# Patient Record
Sex: Female | Born: 1960 | Race: White | Hispanic: No | Marital: Married | Smoking: Never smoker
Health system: Southern US, Community
[De-identification: ages and names within clinical notes are randomized; demographics above are authoritative.]

## PROBLEM LIST (undated history)

## (undated) DIAGNOSIS — B269 Mumps without complication: Secondary | ICD-10-CM

## (undated) DIAGNOSIS — B059 Measles without complication: Secondary | ICD-10-CM

## (undated) DIAGNOSIS — E559 Vitamin D deficiency, unspecified: Secondary | ICD-10-CM

## (undated) DIAGNOSIS — E109 Type 1 diabetes mellitus without complications: Secondary | ICD-10-CM

## (undated) DIAGNOSIS — B019 Varicella without complication: Secondary | ICD-10-CM

## (undated) HISTORY — DX: Type 1 diabetes mellitus without complications: E10.9

## (undated) HISTORY — DX: Vitamin D deficiency, unspecified: E55.9

## (undated) HISTORY — DX: Mumps without complication: B26.9

## (undated) HISTORY — DX: Varicella without complication: B01.9

## (undated) HISTORY — DX: Measles without complication: B05.9

---

## 1997-10-02 ENCOUNTER — Encounter: Admission: RE | Admit: 1997-10-02 | Discharge: 1997-12-31 | Payer: Self-pay | Admitting: Obstetrics and Gynecology

## 1998-01-18 ENCOUNTER — Encounter: Admission: RE | Admit: 1998-01-18 | Discharge: 1998-04-18 | Payer: Self-pay | Admitting: *Deleted

## 1998-05-24 ENCOUNTER — Encounter (HOSPITAL_COMMUNITY): Admission: RE | Admit: 1998-05-24 | Discharge: 1998-08-22 | Payer: Self-pay | Admitting: *Deleted

## 1999-10-08 ENCOUNTER — Other Ambulatory Visit: Admission: RE | Admit: 1999-10-08 | Discharge: 1999-10-08 | Payer: Self-pay | Admitting: Obstetrics & Gynecology

## 2000-11-23 ENCOUNTER — Other Ambulatory Visit: Admission: RE | Admit: 2000-11-23 | Discharge: 2000-11-23 | Payer: Self-pay | Admitting: Obstetrics & Gynecology

## 2001-12-28 ENCOUNTER — Other Ambulatory Visit: Admission: RE | Admit: 2001-12-28 | Discharge: 2001-12-28 | Payer: Self-pay | Admitting: Obstetrics & Gynecology

## 2003-01-12 ENCOUNTER — Other Ambulatory Visit: Admission: RE | Admit: 2003-01-12 | Discharge: 2003-01-12 | Payer: Self-pay | Admitting: Obstetrics & Gynecology

## 2004-04-18 ENCOUNTER — Other Ambulatory Visit: Admission: RE | Admit: 2004-04-18 | Discharge: 2004-04-18 | Payer: Self-pay | Admitting: Obstetrics & Gynecology

## 2005-06-30 ENCOUNTER — Other Ambulatory Visit: Admission: RE | Admit: 2005-06-30 | Discharge: 2005-06-30 | Payer: Self-pay | Admitting: Obstetrics & Gynecology

## 2005-10-13 ENCOUNTER — Ambulatory Visit (HOSPITAL_COMMUNITY): Admission: RE | Admit: 2005-10-13 | Discharge: 2005-10-13 | Payer: Self-pay | Admitting: Obstetrics & Gynecology

## 2005-10-13 ENCOUNTER — Encounter (INDEPENDENT_AMBULATORY_CARE_PROVIDER_SITE_OTHER): Payer: Self-pay | Admitting: Specialist

## 2006-09-02 ENCOUNTER — Encounter: Admission: RE | Admit: 2006-09-02 | Discharge: 2006-09-02 | Payer: Self-pay | Admitting: Obstetrics & Gynecology

## 2006-09-10 ENCOUNTER — Encounter: Admission: RE | Admit: 2006-09-10 | Discharge: 2006-09-10 | Payer: Self-pay | Admitting: Obstetrics & Gynecology

## 2007-09-02 HISTORY — PX: ABLATION: SHX5711

## 2007-09-02 HISTORY — PX: ELBOW ARTHROPLASTY: SHX928

## 2008-03-18 ENCOUNTER — Emergency Department (HOSPITAL_COMMUNITY): Admission: EM | Admit: 2008-03-18 | Discharge: 2008-03-18 | Payer: Self-pay | Admitting: Emergency Medicine

## 2008-03-19 ENCOUNTER — Observation Stay (HOSPITAL_COMMUNITY): Admission: EM | Admit: 2008-03-19 | Discharge: 2008-03-20 | Payer: Self-pay | Admitting: Orthopaedic Surgery

## 2008-05-29 ENCOUNTER — Emergency Department (HOSPITAL_COMMUNITY): Admission: EM | Admit: 2008-05-29 | Discharge: 2008-05-29 | Payer: Self-pay | Admitting: Family Medicine

## 2010-09-26 ENCOUNTER — Encounter
Admission: RE | Admit: 2010-09-26 | Discharge: 2010-09-26 | Payer: Self-pay | Source: Home / Self Care | Attending: Orthopedic Surgery | Admitting: Orthopedic Surgery

## 2011-01-14 NOTE — Op Note (Signed)
[name], [name]                 ACCOUNT NO.:  [id number]   MEDICAL RECORD NO.:  [id number]          PATIENT TYPE:  OBV   LOCATION:  5041                         FACILITY:  [facility]   PHYSICIAN:  Mark C. Ophelia Charter, M.D.    DATE OF BIRTH:  1960-10-25   DATE OF PROCEDURE:  03/19/2008  DATE OF DISCHARGE:  03/20/2008                               OPERATIVE REPORT   PREOPERATIVE DIAGNOSIS:  Displaced right olecranon fracture.   POSTOPERATIVE DIAGNOSIS:  Displaced right olecranon fracture.   PROCEDURE:  Open reduction and internal fixation, right olecranon.  Tension-band wiring technique.   SURGEON:  Mark C. Ophelia Charter, MD   ANESTHESIA:  GOT plus Marcaine local.   PROCEDURE:  After induction of general anesthesia and standard prep and  draping with a proximal arm tourniquet, DuraPrep was used and  preoperative Ancef.  Surgical checklist and time-out was completed.  Posterior incision was made, and subperiosteal dissection on the  olecranon was performed.  Fracture edges were cleaned meticulously.  The  hematoma at the fracture site was irrigated.  The fracture was reduced  with a towel clip and drilled with 4.5 drill bit and a cancellous lag  screw Synthes 7.5-mm screw was inserted that was either 70 or 75 mm in  length .  This lagged across the fracture site compressing it, and the  screw was almost tightened completely down, left a little bit proud; a  small drill hole was made in the cortical bone, and a 20-gauge wire was  passed in a figure-of-eight fashion around the screw head through the  hole and then tightened down securely.  Knots were tied with a square  knot over a Cobb handle to allow for compression.  Fracture was  anatomic.  AP and lateral fluoroscopic spot films were taken with  anatomic position.  After deflation of the tourniquet, hemostasis was  obtained.  Irrigation of subcutaneous tissue and reapproximated with  Vicryl followed by skin closure.  Postop dressing and long arm  splint  was applied.  The patient tolerated the procedure well and was  transferred to the recovery room in stable condition.      Mark C. Ophelia Charter, M.D.  Electronically Signed     MCY/MEDQ  D:  04/12/2008  T:  04/13/2008  Job:  [id number]

## 2011-01-17 NOTE — Op Note (Signed)
NAMEEPSIE, WALTHALL                 ACCOUNT NO.:  [id number]   MEDICAL RECORD NO.:  [id number]          PATIENT TYPE:  AMB   LOCATION:  SDC                           FACILITY:  [facility]   PHYSICIAN:  Freddy Finner, M.D.   DATE OF BIRTH:  [birth year]   DATE OF PROCEDURE:  10/13/2005  DATE OF DISCHARGE:                                 OPERATIVE REPORT   PREOPERATIVE DIAGNOSES:  1.  Menorrhagia.  2.  Multiple endometrial polyps by sonohysterogram.   POSTOPERATIVE DIAGNOSES:  1.  Menorrhagia.  2.  Multiple endometrial polyps by sonohysterogram.   OPERATIVE PROCEDURE:  Hysteroscopy, D&C, resection of polyps, NovaSure  endometrial ablation.   SURGEON:  Dr. Jennette Kettle   ANESTHESIA:  Managed intravenous sedation, paracervical block.   INTRAOPERATIVE COMPLICATIONS:  None.   Patient is a 50 year old with very heavy menses who had sonohysterogram in  the office showing three or four polyps.  She is admitted now for  hysteroscopy, D&C, and NovaSure endometrial ablation.  She is given a bolus  of Rocephin IV preoperatively.  She is brought to the operating room, placed  under IV sedation, placed in the dorsal lithotomy position.  Betadine prep  of mons, perineum, and vagina was carried out in the usual fashion.  Sterile  drapes were applied.  Bivalve vaginal speculum was introduced.  Cervix was  grasped on the anterior lip with a single tooth tenaculum.  Paracervical  block was placed using 10 mL of 1% plain Xylocaine, 5 mL injected in each of  two locations at 4 and 8 o'clock in the vaginal fornices.  Cervix sounded to  4 cm.  Uterus sounded to 9 cm.  Cervix was progressively dilated to 25 with  [facility] dilators.  The 12.5 degree [facility] hysteroscope was introduced and the  polyps were confirmed.  Gentle, thorough curettage was carried out followed  by reinspection with the hysteroscope.  Lactated Ringer's was used for the  distending medium.  Additional polyps were seen and these were curetted and  included with the sample.  The NovaSure device was then applied in a  standard procedure and the ablation carried out.  Cavity width was 4 cm.  Cavity length was 5 cm.  Reinspection revealed adequate ablation of the  endometrium.  Photographs were made before and after ablation and are  retained in the office record.  All instruments were then removed.  The  patient was taken to the recovery room in good condition.  She will be  discharged home with routine outpatient surgical instructions for follow-up  in approximately two weeks.  She is given Vicodin to be taken as needed for  postoperative pain.     Freddy Finner, M.D.  Electronically Signed    WRN/MEDQ  D:  10/13/2005  T:  10/13/2005  Job:  [id number]

## 2011-05-30 LAB — POCT I-STAT 4, (NA,K, GLUC, HGB,HCT)
Glucose, Bld: 255 — ABNORMAL HIGH
HCT: 40
Operator id: 238531
Sodium: 137

## 2011-05-30 LAB — BASIC METABOLIC PANEL
CO2: 28
Calcium: 9.1
Chloride: 102
GFR calc Af Amer: >60
GFR calc non Af Amer: >60
Glucose, Bld: 129 — ABNORMAL HIGH

## 2011-05-30 LAB — CBC
MCHC: 34
MCV: 97.2
Platelets: 322
RDW: 13.7
WBC: 9.8

## 2015-01-15 ENCOUNTER — Other Ambulatory Visit: Payer: Self-pay | Admitting: Obstetrics & Gynecology

## 2015-01-16 LAB — CYTOLOGY - PAP

## 2015-02-27 ENCOUNTER — Encounter: Payer: Self-pay | Admitting: Internal Medicine

## 2015-05-09 ENCOUNTER — Ambulatory Visit (INDEPENDENT_AMBULATORY_CARE_PROVIDER_SITE_OTHER): Payer: BLUE CROSS/BLUE SHIELD | Admitting: Internal Medicine

## 2015-05-09 ENCOUNTER — Encounter: Payer: Self-pay | Admitting: Internal Medicine

## 2015-05-09 VITALS — BP 132/70 | HR 80 | Ht 62.0 in | Wt 155.4 lb

## 2015-05-09 DIAGNOSIS — K5900 Constipation, unspecified: Secondary | ICD-10-CM

## 2015-05-09 DIAGNOSIS — Z1211 Encounter for screening for malignant neoplasm of colon: Secondary | ICD-10-CM | POA: Diagnosis not present

## 2015-05-09 DIAGNOSIS — R1010 Upper abdominal pain, unspecified: Secondary | ICD-10-CM | POA: Diagnosis not present

## 2015-05-09 MED ORDER — NA SULFATE-K SULFATE-MG SULF 17.5-3.13-1.6 GM/177ML PO SOLN: 1.0000 | Freq: Once | ORAL | Status: DC

## 2015-05-09 NOTE — Progress Notes (Signed)
HISTORY OF PRESENT ILLNESS:  Michele Kirby is a 54 y.o. female who is referred today by her primary care provider Dr. Burnard Bunting with a chief complaint of abdominal complaints and the need for colonoscopy. She is a long-standing type I diabetic who wears insulin pump. She tells me that she began Weight Watchers and April. In June she began to develop upper abdominal pain and constipation. She was evaluated and began probiotic Align as well as on demand PPI. Also Metamucil. The following month her symptoms had resolved. She has had no recurrence. She is off the diet. She also mentions that she needs screening colonoscopy. Her grandmother had colon cancer. The patient's GI review of systems was otherwise negative. No bleeding.  REVIEW OF SYSTEMS:  All non-GI ROS negative upon comprehensive review of all systems  Past Medical History  Diagnosis Date  . Diabetes mellitus type 1   . Vitamin D deficiency   . Measles   . Mumps   . Chicken pox     Past Surgical History  Procedure Laterality Date  . Cesarean section      x 2  . Elbow arthroplasty  2009  . Ablation  2009    with D&C    Social History DACY ENRICO  reports that she has never smoked. She has never used smokeless tobacco. She reports that she drinks alcohol. She reports that she does not use illicit drugs.  family history includes Breast cancer in her mother; COPD in her maternal grandmother; Colon cancer in her maternal grandmother; Heart disease in her brother; Peripheral vascular disease in her maternal grandmother; Thyroid disease in her father and mother; Tuberculosis in her mother.  Allergies  Allergen Reactions  . Codeine     Gi upset       PHYSICAL EXAMINATION: Vital signs: BP 132/70 mmHg  Pulse 80  Ht 5\' 2"  (1.575 m)  Wt 155 lb 6.4 oz (70.489 kg)  BMI 28.42 kg/m2  Constitutional: generally well-appearing, no acute distress Psychiatric: alert and oriented x3, cooperative Eyes: extraocular movements  intact, anicteric, conjunctiva pink Mouth: oral pharynx moist, no lesions Neck: supple without thyromegaly Lymph: no lymphadenopathy Cardiovascular: heart regular rate and rhythm, no murmur Lungs: clear to auscultation bilaterally Abdomen: soft, nontender, nondistended, no obvious ascites, no peritoneal signs, normal bowel sounds, no organomegaly Rectal: Deferred until upcoming colonoscopy Extremities: no clubbing cyanosis or lower extremity edema bilaterally Skin: no lesions on visible extremities Neuro: No focal deficits.   ASSESSMENT:  #1. Transient abdominal complaints as manifested by discomfort constipation. Likely secondary to significant dietary change as described. Improved with fiber. If the pain were to recur, consider abdominal ultrasound and/or upper endoscopy #2. Colon cancer screening. Baseline risk. Appropriate candidate without contraindication #3. Insulin requiring diabetes mellitus  PLAN:  #1. Continue fiber supplementation and adequate water intake #2. Schedule screening colonoscopy.The nature of the procedure, as well as the risks, benefits, and alternatives were carefully and thoroughly reviewed with the patient. Ample time for discussion and questions allowed. The patient understood, was satisfied, and agreed to proceed. #3. Adjust insulin pump to avoid hypoglycemia. Discussed. She will touch base with Dr. Reynaldo Minium for specific instructions   A copy of this consultation note has been sent to Dr. Reynaldo Minium

## 2015-05-09 NOTE — Patient Instructions (Signed)

## 2015-05-23 ENCOUNTER — Telehealth: Payer: Self-pay

## 2015-05-23 NOTE — Telephone Encounter (Signed)
Faxed insulin pump letter to Dr. Reynaldo Minium  Left message with DJ requesting response

## 2015-06-14 ENCOUNTER — Telehealth: Payer: Self-pay

## 2015-06-14 NOTE — Telephone Encounter (Signed)
refaxed insulin pump letter to Dr. Jacquiline Doe office

## 2015-06-14 NOTE — Telephone Encounter (Signed)
Spoke with D.J. In Dr. Jacquiline Doe office.  At her request I re-faxed the insulin pump letter.

## 2015-07-10 ENCOUNTER — Telehealth: Payer: Self-pay

## 2015-07-10 NOTE — Telephone Encounter (Signed)
Spoke with Michele Kirby who requested I refax the insulin pump letter.  Awaiting response

## 2015-07-17 ENCOUNTER — Telehealth: Payer: Self-pay

## 2015-07-17 NOTE — Telephone Encounter (Signed)
Left message with patient that, per Dr. Reynaldo Minium, insulin pump instructions for procedure as follows:  Run basal rate; no bolus unless glucose is greater than 175

## 2015-07-19 ENCOUNTER — Ambulatory Visit (AMBULATORY_SURGERY_CENTER): Payer: BLUE CROSS/BLUE SHIELD | Admitting: Internal Medicine

## 2015-07-19 ENCOUNTER — Encounter: Payer: Self-pay | Admitting: Internal Medicine

## 2015-07-19 VITALS — BP 109/69 | HR 83 | Temp 97.6°F | Resp 13 | Ht 62.0 in | Wt 155.0 lb

## 2015-07-19 DIAGNOSIS — D123 Benign neoplasm of transverse colon: Secondary | ICD-10-CM | POA: Diagnosis not present

## 2015-07-19 DIAGNOSIS — Z1211 Encounter for screening for malignant neoplasm of colon: Secondary | ICD-10-CM | POA: Diagnosis not present

## 2015-07-19 DIAGNOSIS — D125 Benign neoplasm of sigmoid colon: Secondary | ICD-10-CM | POA: Diagnosis not present

## 2015-07-19 LAB — GLUCOSE, CAPILLARY
GLUCOSE-CAPILLARY: 206 mg/dL — AB (ref 65–99)
Glucose-Capillary: 145 mg/dL — ABNORMAL HIGH (ref 65–99)

## 2015-07-19 MED ORDER — SODIUM CHLORIDE 0.9 % IV SOLN
500.0000 mL | INTRAVENOUS | Status: DC
Start: 2015-07-19 — End: 2015-07-19

## 2015-07-19 NOTE — Progress Notes (Signed)
Called to room to assist during endoscopic procedure.  Patient ID and intended procedure confirmed with present staff. Received instructions for my participation in the procedure from the performing physician.  

## 2015-07-19 NOTE — Progress Notes (Signed)
A/ox3 pleased with MAC, report to Jane RN 

## 2015-07-19 NOTE — Op Note (Signed)
[facility]  [address] [place], [address]   COLONOSCOPY PROCEDURE REPORT  PATIENT: Michele Kirby, Michele Kirby  MR#: [id number] BIRTHDATE: 05/04/1961 , 49  yrs. old GENDER: female ENDOSCOPIST: Eustace Quail, MD REFERRED [id number] Reynaldo Minium, M.D. PROCEDURE DATE:  07/19/2015 PROCEDURE:   Colonoscopy, screening and Colonoscopy with snare polypectomy x 2 First Screening Colonoscopy - Avg.  risk and is 50 yrs.  old or older Yes.  Prior Negative Screening - Now for repeat screening. N/A  History of Adenoma - Now for follow-up colonoscopy & has been > or = to 3 yrs.  N/A  Polyps removed today? Yes ASA CLASS:   Class II INDICATIONS:Screening for colonic neoplasia and Colorectal Neoplasm Risk Assessment for this procedure is average risk. MEDICATIONS: Monitored anesthesia care and Propofol 400 mg IV  DESCRIPTION OF PROCEDURE:   After the risks benefits and alternatives of the procedure were thoroughly explained, informed consent was obtained.  The digital rectal exam revealed no abnormalities of the rectum.   The [id number]  endoscope was introduced through the anus and advanced to the cecum, which was identified by both the appendix and ileocecal valve. No adverse events experienced.   The quality of the prep was excellent. (Suprep was used)  The instrument was then slowly withdrawn as the colon was fully examined. Estimated blood loss is zero unless otherwise noted in this procedure report.     COLON FINDINGS: Two polyps measuring 5 mm in size were found in the transverse colon and sigmoid colon.  A polypectomy was performed with a cold snare.  The resection was complete, the polyp tissue was completely retrieved and sent to histology.   The examination was otherwise normal.  Retroflexed views revealed internal hemorrhoids. The time to cecum = 3.6 Withdrawal time = 10.1   The scope was withdrawn and the procedure completed. COMPLICATIONS: There were no immediate  complications.  ENDOSCOPIC IMPRESSION: 1.   Two polyps were found in the transverse colon and sigmoid colon; polypectomy was performed with a cold snare 2.   The examination was otherwise normal  RECOMMENDATIONS: 1. Repeat colonoscopy in 5 years if polyp adenomatous; otherwise 10 years  eSigned:  Eustace Quail, MD 07/19/2015 9:09 AM   cc: Burnard Bunting, MD and The Patient

## 2015-07-19 NOTE — Patient Instructions (Signed)

## 2015-07-20 ENCOUNTER — Telehealth: Payer: Self-pay | Admitting: Internal Medicine

## 2015-07-20 NOTE — Telephone Encounter (Signed)
°  Follow up Call-  Call back number 07/19/2015  Post procedure Call Back phone  # [phone]  Permission to leave phone message Yes     Patient questions:  Do you have a fever, pain , or abdominal swelling? No. Pain Score  0 *  Have you tolerated food without any problems? Yes.    Have you been able to return to your normal activities? Yes.    Do you have any questions about your discharge instructions: Diet   No. Medications  No. Follow up visit  No.  Do you have questions or concerns about your Care? No.  Actions: * If pain score is 4 or above: No action needed, pain <4.

## 2015-07-24 ENCOUNTER — Encounter: Payer: Self-pay | Admitting: Internal Medicine

## 2015-08-16 NOTE — Telephone Encounter (Signed)
Insulin pump instructions given

## 2017-07-10 DIAGNOSIS — E109 Type 1 diabetes mellitus without complications: Secondary | ICD-10-CM | POA: Diagnosis not present

## 2017-07-10 DIAGNOSIS — Z794 Long term (current) use of insulin: Secondary | ICD-10-CM | POA: Diagnosis not present

## 2017-07-10 DIAGNOSIS — E108 Type 1 diabetes mellitus with unspecified complications: Secondary | ICD-10-CM | POA: Diagnosis not present

## 2017-07-15 DIAGNOSIS — K5900 Constipation, unspecified: Secondary | ICD-10-CM | POA: Diagnosis not present

## 2017-07-15 DIAGNOSIS — E559 Vitamin D deficiency, unspecified: Secondary | ICD-10-CM | POA: Diagnosis not present

## 2017-07-15 DIAGNOSIS — Z23 Encounter for immunization: Secondary | ICD-10-CM | POA: Diagnosis not present

## 2017-07-15 DIAGNOSIS — E1065 Type 1 diabetes mellitus with hyperglycemia: Secondary | ICD-10-CM | POA: Diagnosis not present

## 2017-10-20 DIAGNOSIS — E109 Type 1 diabetes mellitus without complications: Secondary | ICD-10-CM | POA: Diagnosis not present

## 2017-10-20 DIAGNOSIS — E108 Type 1 diabetes mellitus with unspecified complications: Secondary | ICD-10-CM | POA: Diagnosis not present

## 2017-10-20 DIAGNOSIS — Z794 Long term (current) use of insulin: Secondary | ICD-10-CM | POA: Diagnosis not present

## 2017-11-11 DIAGNOSIS — R82998 Other abnormal findings in urine: Secondary | ICD-10-CM | POA: Diagnosis not present

## 2017-11-11 DIAGNOSIS — K5909 Other constipation: Secondary | ICD-10-CM | POA: Diagnosis not present

## 2017-11-11 DIAGNOSIS — E1065 Type 1 diabetes mellitus with hyperglycemia: Secondary | ICD-10-CM | POA: Diagnosis not present

## 2017-11-11 DIAGNOSIS — E559 Vitamin D deficiency, unspecified: Secondary | ICD-10-CM | POA: Diagnosis not present

## 2017-12-08 DIAGNOSIS — E108 Type 1 diabetes mellitus with unspecified complications: Secondary | ICD-10-CM | POA: Diagnosis not present

## 2017-12-16 DIAGNOSIS — E1065 Type 1 diabetes mellitus with hyperglycemia: Secondary | ICD-10-CM | POA: Diagnosis not present

## 2017-12-16 DIAGNOSIS — E108 Type 1 diabetes mellitus with unspecified complications: Secondary | ICD-10-CM | POA: Diagnosis not present

## 2018-01-01 DIAGNOSIS — Z794 Long term (current) use of insulin: Secondary | ICD-10-CM | POA: Diagnosis not present

## 2018-01-01 DIAGNOSIS — Z4681 Encounter for fitting and adjustment of insulin pump: Secondary | ICD-10-CM | POA: Diagnosis not present

## 2018-01-01 DIAGNOSIS — E1065 Type 1 diabetes mellitus with hyperglycemia: Secondary | ICD-10-CM | POA: Diagnosis not present

## 2018-01-04 DIAGNOSIS — Z794 Long term (current) use of insulin: Secondary | ICD-10-CM | POA: Diagnosis not present

## 2018-01-04 DIAGNOSIS — E108 Type 1 diabetes mellitus with unspecified complications: Secondary | ICD-10-CM | POA: Diagnosis not present

## 2018-01-04 DIAGNOSIS — E109 Type 1 diabetes mellitus without complications: Secondary | ICD-10-CM | POA: Diagnosis not present

## 2018-02-10 DIAGNOSIS — Z4681 Encounter for fitting and adjustment of insulin pump: Secondary | ICD-10-CM | POA: Diagnosis not present

## 2018-02-10 DIAGNOSIS — E1065 Type 1 diabetes mellitus with hyperglycemia: Secondary | ICD-10-CM | POA: Diagnosis not present

## 2018-02-10 DIAGNOSIS — Z794 Long term (current) use of insulin: Secondary | ICD-10-CM | POA: Diagnosis not present

## 2018-02-16 DIAGNOSIS — E108 Type 1 diabetes mellitus with unspecified complications: Secondary | ICD-10-CM | POA: Diagnosis not present

## 2018-02-16 DIAGNOSIS — Z794 Long term (current) use of insulin: Secondary | ICD-10-CM | POA: Diagnosis not present

## 2018-02-16 DIAGNOSIS — E109 Type 1 diabetes mellitus without complications: Secondary | ICD-10-CM | POA: Diagnosis not present

## 2018-03-17 DIAGNOSIS — E1065 Type 1 diabetes mellitus with hyperglycemia: Secondary | ICD-10-CM | POA: Diagnosis not present

## 2018-03-17 DIAGNOSIS — Z794 Long term (current) use of insulin: Secondary | ICD-10-CM | POA: Diagnosis not present

## 2018-05-17 DIAGNOSIS — E109 Type 1 diabetes mellitus without complications: Secondary | ICD-10-CM | POA: Diagnosis not present

## 2018-05-17 DIAGNOSIS — Z794 Long term (current) use of insulin: Secondary | ICD-10-CM | POA: Diagnosis not present

## 2018-05-17 DIAGNOSIS — E108 Type 1 diabetes mellitus with unspecified complications: Secondary | ICD-10-CM | POA: Diagnosis not present

## 2018-06-23 DIAGNOSIS — Z01419 Encounter for gynecological examination (general) (routine) without abnormal findings: Secondary | ICD-10-CM | POA: Diagnosis not present

## 2018-06-23 DIAGNOSIS — Z6829 Body mass index (BMI) 29.0-29.9, adult: Secondary | ICD-10-CM | POA: Diagnosis not present

## 2018-07-15 DIAGNOSIS — E109 Type 1 diabetes mellitus without complications: Secondary | ICD-10-CM | POA: Diagnosis not present

## 2018-07-15 DIAGNOSIS — Z23 Encounter for immunization: Secondary | ICD-10-CM | POA: Diagnosis not present

## 2018-07-15 DIAGNOSIS — E559 Vitamin D deficiency, unspecified: Secondary | ICD-10-CM | POA: Diagnosis not present

## 2018-07-15 DIAGNOSIS — Z794 Long term (current) use of insulin: Secondary | ICD-10-CM | POA: Diagnosis not present

## 2018-09-17 DIAGNOSIS — E1065 Type 1 diabetes mellitus with hyperglycemia: Secondary | ICD-10-CM | POA: Diagnosis not present

## 2018-09-17 DIAGNOSIS — E108 Type 1 diabetes mellitus with unspecified complications: Secondary | ICD-10-CM | POA: Diagnosis not present

## 2018-11-15 DIAGNOSIS — Z794 Long term (current) use of insulin: Secondary | ICD-10-CM | POA: Diagnosis not present

## 2018-11-15 DIAGNOSIS — E559 Vitamin D deficiency, unspecified: Secondary | ICD-10-CM | POA: Diagnosis not present

## 2018-11-15 DIAGNOSIS — E109 Type 1 diabetes mellitus without complications: Secondary | ICD-10-CM | POA: Diagnosis not present

## 2019-05-13 ENCOUNTER — Ambulatory Visit (INDEPENDENT_AMBULATORY_CARE_PROVIDER_SITE_OTHER): Payer: 59 | Admitting: Orthopedic Surgery

## 2019-05-13 ENCOUNTER — Encounter: Payer: Self-pay | Admitting: Orthopedic Surgery

## 2019-05-13 ENCOUNTER — Ambulatory Visit: Payer: Self-pay

## 2019-05-13 DIAGNOSIS — M79672 Pain in left foot: Secondary | ICD-10-CM | POA: Diagnosis not present

## 2019-05-13 LAB — VITAMIN D 25 HYDROXY (VIT D DEFICIENCY, FRACTURES): Vit D, 25-Hydroxy: 13 ng/mL — ABNORMAL LOW (ref 30–100)

## 2019-05-13 LAB — EXTRA LAV TOP TUBE

## 2019-05-14 ENCOUNTER — Encounter: Payer: Self-pay | Admitting: Orthopedic Surgery

## 2019-05-14 NOTE — Progress Notes (Signed)
Office Visit Note   Patient: Michele Kirby           Date of Birth: 19-Jun-1961           MRN: [id number] Visit Date: 05/13/2019 Requested by: Burnard Bunting, MD [address] [place],  [place] [address] PCP: Burnard Bunting, MD  Subjective: Chief Complaint  Patient presents with  . Left Foot - Pain    HPI: Kayley is a patient with left foot pain.  Has had pain for about a month.  She reports pain essentially with every step and she localizes it to the lateral aspect of her left foot.  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  It is painful for her to weight-bear.  She does have a history of stress fracture in the foot over 20 years ago.  She also has a history of low vitamin D and she was on medication for that.  She denies any focal swelling.              ROS: All systems reviewed are negative as they relate to the chief complaint within the history of present illness.  Patient denies  fevers or chills.   Assessment & Plan: Visit Diagnoses:  1. Pain in left foot     Plan: Impression is left foot pain with slight irregularity in the metadiaphyseal region of that fifth metatarsal.  Concerning for stress fracture or stress reaction in the hypovascular region of that metatarsal shaft.  I recommend vitamin D level because of the high likelihood of a stress reaction in that region.  At the time of this dictation her level did come back at 13 which is low.  I will call her about that level and then we will supplement.  She also needs MRI scan of the left foot as well as nonweightbearing with a knee scooter.  I will see her back after that study.  Follow-Up Instructions: Return for after MRI.   Orders:  Orders Placed This Encounter  Procedures  . XR Foot Complete Left  . MR Foot Left w/o contrast  . Vitamin D (25 hydroxy)  . EXTRA LAV TOP TUBE   No orders of the defined types were placed in this encounter.     Procedures: No procedures performed   Clinical Data: No  additional findings.  Objective: Vital Signs: There were no vitals taken for this visit.  Physical Exam:   Constitutional: Patient appears well-developed HEENT:  Head: Normocephalic Eyes:EOM are normal Neck: Normal range of motion Cardiovascular: Normal rate Pulmonary/chest: Effort normal Neurologic: Patient is alert Skin: Skin is warm Psychiatric: Patient has normal mood and affect    Ortho Exam: Ortho exam demonstrates slightly antalgic gait to the left but with no focal swelling in the tarsometatarsal region.  Pedal pulses palpable.  On the left-hand side the patient has palpable intact nontender anterior to posterior to peroneal and Achilles tendons.  She does have tenderness to palpation over the fifth and fourth metatarsal shaft proximally.  No lesser toe deformity present  Specialty Comments:  No specialty comments available.  Imaging: No results found.   PMFS History: There are no active problems to display for this patient.  Past Medical History:  Diagnosis Date  . Chicken pox   . Diabetes mellitus type 1 ([place])   . Measles   . Mumps   . Vitamin D deficiency     Family History  Problem Relation Age of Onset  . Thyroid disease Mother   .  Thyroid disease Father   . Tuberculosis Mother   . Peripheral vascular disease Maternal Grandmother   . COPD Maternal Grandmother   . Colon cancer Maternal Grandmother   . Breast cancer Mother   . Heart disease Brother     Past Surgical History:  Procedure Laterality Date  . ABLATION  2009   with D&C  . CESAREAN SECTION     x 2  . ELBOW ARTHROPLASTY  2009   Social History   Occupational History  . Not on file  Tobacco Use  . Smoking status: Never Smoker  . Smokeless tobacco: Never Used  Substance and Sexual Activity  . Alcohol use: Yes    Alcohol/week: 1.0 standard drinks    Types: 1 Glasses of wine per week    Comment: 1 drink daily  . Drug use: No  . Sexual activity: Not on file

## 2019-05-16 MED ORDER — VITAMIN D (ERGOCALCIFEROL) 1.25 MG (50000 UNIT) PO CAPS: 50000.0000 [IU] | ORAL_CAPSULE | ORAL | 0 refills | Status: AC

## 2019-05-16 NOTE — Progress Notes (Signed)
Advised patient of results and medication sent to pharmacy.

## 2019-05-16 NOTE — Progress Notes (Signed)
rx submitted to pharmacy. Will call her to advise.

## 2019-05-16 NOTE — Addendum Note (Signed)
Addended byLaurann Montana on: 05/16/2019 05:35 AM   Modules accepted: Orders

## 2019-05-18 ENCOUNTER — Telehealth: Payer: Self-pay | Admitting: Orthopedic Surgery

## 2019-05-18 NOTE — Telephone Encounter (Signed)
Patient called.says she has not heard from anyone to schedule MRI. Her call back number is [phone]

## 2019-05-18 NOTE — Telephone Encounter (Signed)
Is there anyway that you could help with this while Michele Kirby is out?

## 2019-05-19 NOTE — Telephone Encounter (Signed)
appt is now scheduled for MRI

## 2019-05-22 ENCOUNTER — Other Ambulatory Visit: Payer: Self-pay

## 2019-05-22 ENCOUNTER — Ambulatory Visit
Admission: RE | Admit: 2019-05-22 | Discharge: 2019-05-22 | Disposition: A | Discharge status: Home / Self Care | Payer: 59 | Source: Ambulatory Visit | Attending: Orthopedic Surgery | Admitting: Orthopedic Surgery

## 2019-05-22 DIAGNOSIS — M79672 Pain in left foot: Secondary | ICD-10-CM

## 2019-05-22 NOTE — Progress Notes (Signed)
As suspected the patient has low vitamin D and stress fracture of the cuboid and fourth metatarsal.  She needs to be nonweightbearing for a period of 3 to 4 weeks in a knee scooter.  Please call her to inform of same.  Thanks

## 2019-05-23 ENCOUNTER — Telehealth: Payer: Self-pay | Admitting: Orthopedic Surgery

## 2019-05-23 NOTE — Telephone Encounter (Signed)
IC sw patient She received my message will be NWTB for the next 4 weeks. Will follow up with Dr Marlou Sa after and has started Vit D prescription.

## 2019-05-23 NOTE — Telephone Encounter (Signed)
Patient called left voicemail message that she received call from [place] who gave information about her stress Fx. Patient said she have a couple of questions to ask. The number to contact patient is [phone]

## 2019-05-25 ENCOUNTER — Ambulatory Visit: Payer: 59 | Admitting: Orthopedic Surgery

## 2019-07-01 ENCOUNTER — Other Ambulatory Visit: Payer: Self-pay

## 2019-07-01 DIAGNOSIS — Z20822 Contact with and (suspected) exposure to covid-19: Secondary | ICD-10-CM

## 2019-07-02 LAB — NOVEL CORONAVIRUS, NAA: SARS-CoV-2, NAA: NOT DETECTED

## 2020-06-01 ENCOUNTER — Ambulatory Visit (INDEPENDENT_AMBULATORY_CARE_PROVIDER_SITE_OTHER): Payer: 59 | Admitting: Orthopedic Surgery

## 2020-06-01 DIAGNOSIS — M65342 Trigger finger, left ring finger: Secondary | ICD-10-CM | POA: Diagnosis not present

## 2020-06-03 ENCOUNTER — Encounter: Payer: Self-pay | Admitting: Orthopedic Surgery

## 2020-06-03 NOTE — Progress Notes (Signed)
Office Visit Note   Patient: Michele Kirby           Date of Birth: 1960-12-10           MRN: [id number] Visit Date: 06/01/2020 Requested by: Burnard Bunting, MD [address] [place],  [place] [address] PCP: Burnard Bunting, MD  Subjective: Chief Complaint  Patient presents with  . finger pain    left ring finger-trigger finger    HPI: Michele Kirby is a 59 year old patient right-hand-dominant with left ring trigger finger for 3 months.'s been worse over the last 4 weeks.  She does a lot of typing at work.  She does have insulin-dependent diabetes with hemoglobin A1c 7.1.  This is the first time she is had any type of trigger finger.  She has to use the other hand to reduce the left ring finger.  Denies any trauma.              ROS: All systems reviewed are negative as they relate to the chief complaint within the history of present illness.  Patient denies  fevers or chills.   Assessment & Plan: Visit Diagnoses:  1. Trigger finger, left ring finger     Plan: Impression is symptomatic left ring stenosing tenosynovitis.  Plan is ultrasound-guided injection today.  I think that is about a 50-50 chance of taking care of this problem.  If it continues then trigger finger release would have to be considered.  Follow-up with [place] as needed.  Ultrasound-guided injection performed today.  Follow-Up Instructions: Return if symptoms worsen or fail to improve.   Orders:  No orders of the defined types were placed in this encounter.  No orders of the defined types were placed in this encounter.     Procedures: No procedures performed   Clinical Data: No additional findings.  Objective: Vital Signs: There were no vitals taken for this visit.  Physical Exam:   Constitutional: Patient appears well-developed HEENT:  Head: Normocephalic Eyes:EOM are normal Neck: Normal range of motion Cardiovascular: Normal rate Pulmonary/chest: Effort normal Neurologic: Patient is alert Skin: Skin  is warm Psychiatric: Patient has normal mood and affect    Ortho Exam: Ortho exam demonstrates tender triggering and tenderness over the A1 pulley of the left ring finger.  She may also have early Dupuytren's contracture of the palmar fascia but it is a little bit difficult to say at this time.  No fixed flexion contracture is present but there is a little bit of thickening of the fascia in the palmar region of both hands which could be early Dupuytren's.  She does have obvious triggering of the left ring finger.  Specialty Comments:  No specialty comments available.  Imaging: No results found.   PMFS History: There are no problems to display for this patient.  Past Medical History:  Diagnosis Date  . Chicken pox   . Diabetes mellitus type 1 ([place])   . Measles   . Mumps   . Vitamin D deficiency     Family History  Problem Relation Age of Onset  . Thyroid disease Mother   . Thyroid disease Father   . Tuberculosis Mother   . Peripheral vascular disease Maternal Grandmother   . COPD Maternal Grandmother   . Colon cancer Maternal Grandmother   . Breast cancer Mother   . Heart disease Brother     Past Surgical History:  Procedure Laterality Date  . ABLATION  2009   with D&C  . CESAREAN SECTION  x 2  . ELBOW ARTHROPLASTY  2009   Social History   Occupational History  . Not on file  Tobacco Use  . Smoking status: Never Smoker  . Smokeless tobacco: Never Used  Substance and Sexual Activity  . Alcohol use: Yes    Alcohol/week: 1.0 standard drink    Types: 1 Glasses of wine per week    Comment: 1 drink daily  . Drug use: No  . Sexual activity: Not on file

## 2020-06-07 ENCOUNTER — Encounter: Payer: Self-pay | Admitting: Gastroenterology

## 2020-07-12 IMAGING — MR MR FOOT*L* W/O CM
5 series · 40 of 40 positions shown · non-contrast
Comparison: None.

CLINICAL DATA: Foot pain along the lateral aspect. No known injury.

EXAM:
MRI OF THE LEFT FOOT WITHOUT CONTRAST
TECHNIQUE: Multiplanar, multisequence MR imaging of the left foot was
performed. No intravenous contrast was administered.

[Series 4: T1 · coronal · left · 3.0mm · 0.38mm/px · 12 of 51 slices shown (1 of 2)]
[im 1/51]
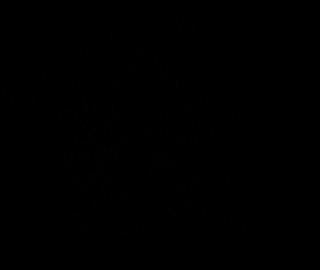
[im 5/51]
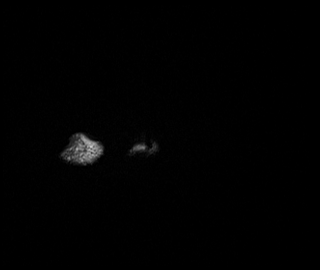
[im 10/51]
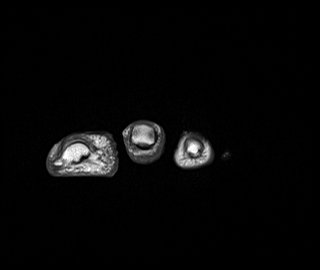
[im 14/51]
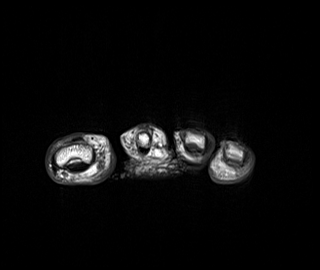
[im 19/51]
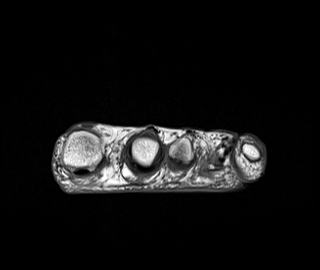
[im 23/51]
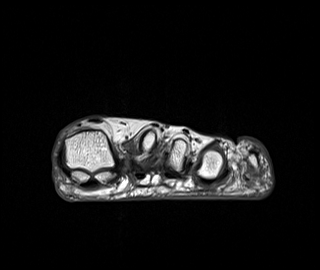
[im 28/51]
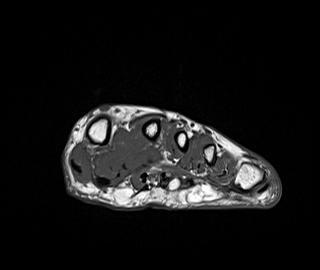
[im 32/51]
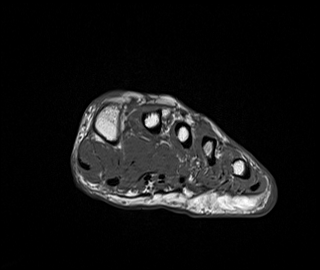
[im 37/51]
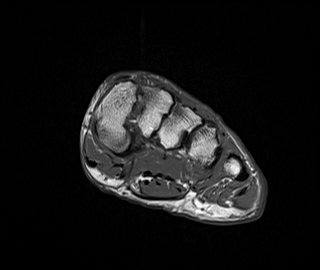
[im 41/51]
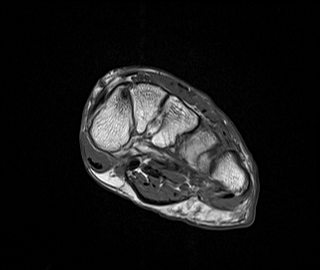
[im 46/51]
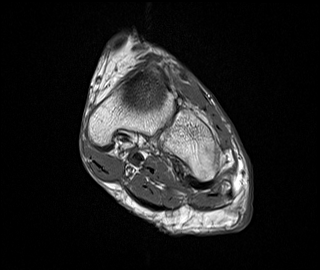
[im 51/51]
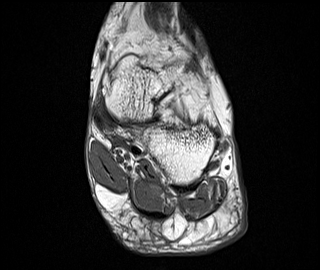

[Series 5: T2 fat-sat · coronal · left · 3.0mm · 0.38mm/px · 12 of 51 slices shown (1 of 2)]
[im 1/51]
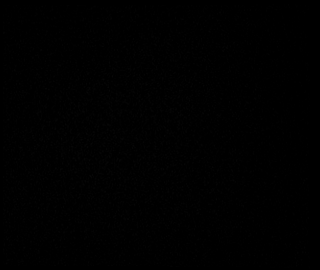
[im 5/51]
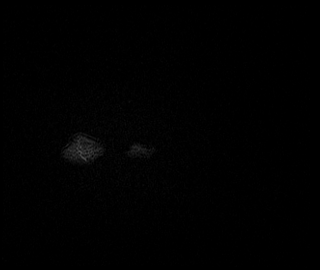
[im 10/51]
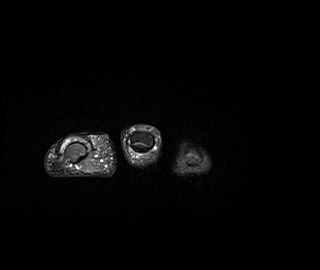
[im 14/51]
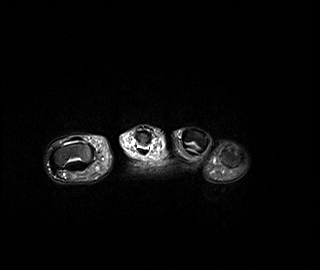
[im 19/51]
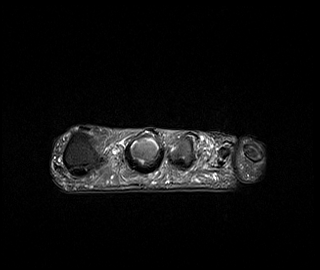
[im 23/51]
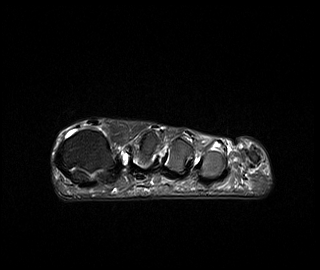
[im 28/51]
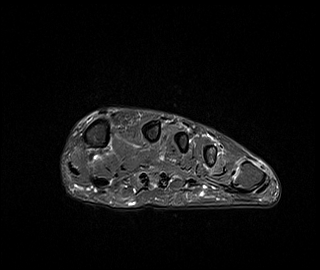
[im 32/51]
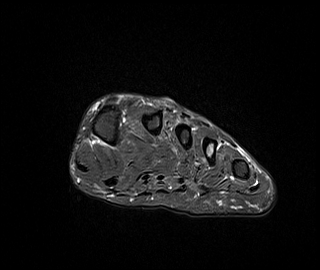
[im 37/51]
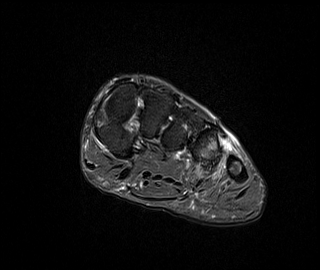
[im 41/51]
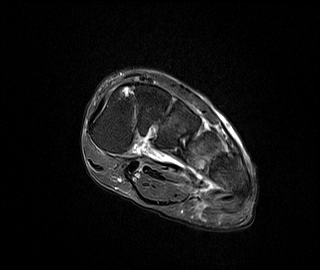
[im 46/51]
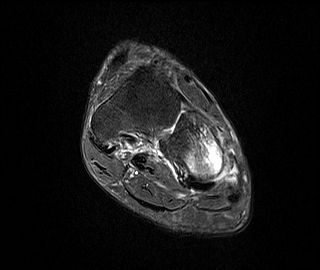
[im 51/51]
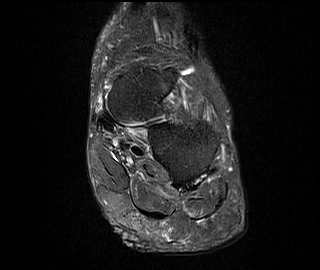

[Series 6: STIR · sagittal · left · 3.0mm · 0.74mm/px · 6 of 28 slices shown]
[im 1/28]
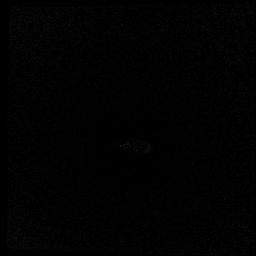
[im 6/28]
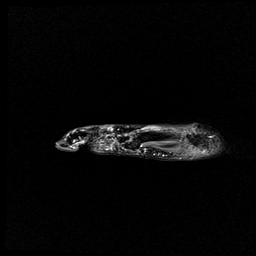
[im 11/28]
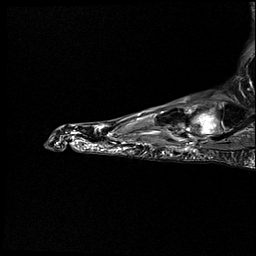
[im 17/28]
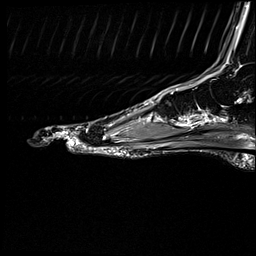
[im 22/28]
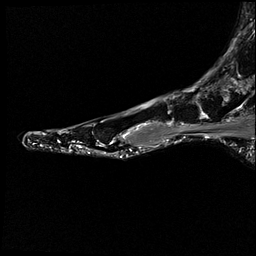
[im 28/28]
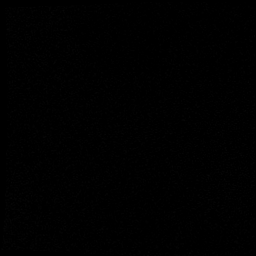

[Series 7: T1 · axial · left · 3.0mm · 0.59mm/px · z∈[-48,+37]mm · 5 of 23 slices shown (2 of 2)]
[im 1/23]
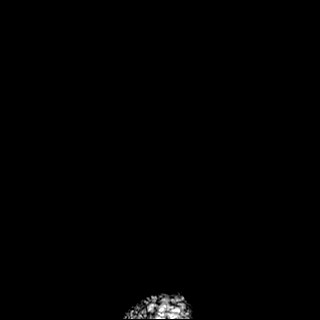
[im 6/23]
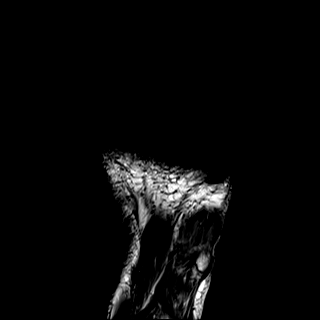
[im 12/23]
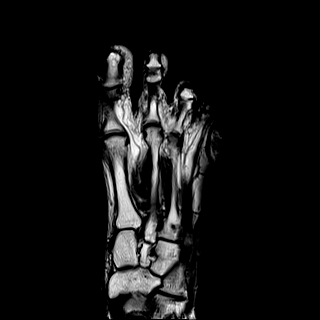
[im 17/23]
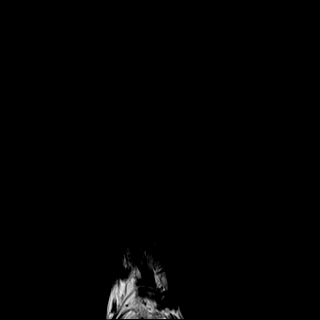
[im 23/23]
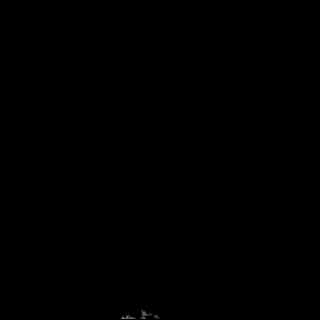

[Series 8: T2 fat-sat · axial · left · 3.0mm · 0.59mm/px · z∈[-48,+37]mm · 5 of 23 slices shown (2 of 2)]
[im 1/23]
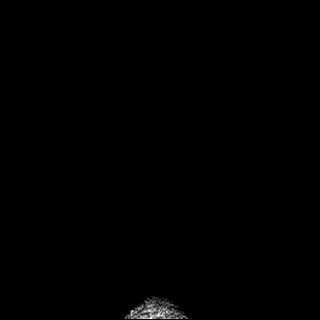
[im 6/23]
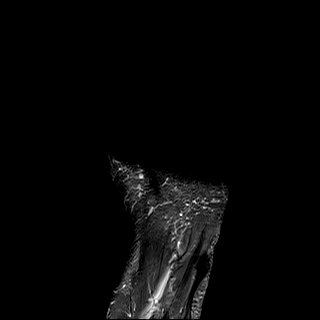
[im 12/23]
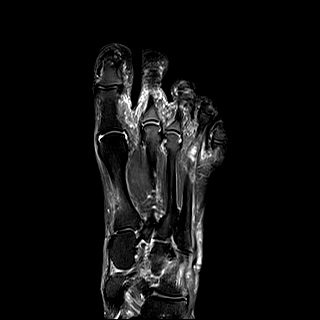
[im 17/23]
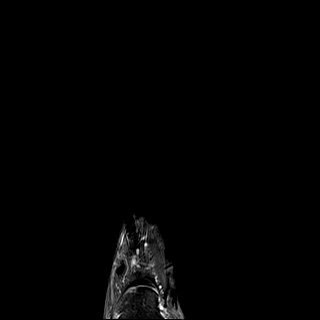
[im 23/23]
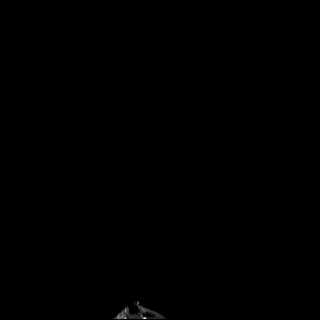

[40 of 40 positions shown; findings below may reference images not displayed]

FINDINGS: Bones/Joint/Cartilage

Severe marrow edema in the distal lateral aspect of the cuboid with
a linear low signal abnormality peripherally most concerning for a
nondisplaced fracture.

Severe marrow edema in the proximal shaft of the fourth metatarsal
most concerning for stress reaction.

Minimal marrow edema in the lateral cuneiform likely reactive. No
acute fracture or dislocation. Normal alignment. No joint effusion.

Ligaments

Collateral ligaments are intact.  Lisfranc ligament is intact.

Muscles and Tendons
Flexor, peroneal and extensor compartment tendons are intact.
Muscles are normal.

Soft tissue
No fluid collection or hematoma.  No soft tissue mass.
IMPRESSION: 1. Nondisplaced stress fracture of the distal lateral aspect of the
cuboid.
2. Severe stress reaction of the shaft of the fourth metatarsal.

## 2020-07-14 ENCOUNTER — Ambulatory Visit: Payer: 59 | Attending: Internal Medicine

## 2020-07-14 DIAGNOSIS — Z23 Encounter for immunization: Secondary | ICD-10-CM

## 2020-07-14 NOTE — Progress Notes (Signed)
   [facility]  Name:  Michele Kirby    MRN: [id number] DOB: [birth year]  07/14/2020  Michele Kirby was observed post Covid-19 immunization for 15 minutes without incident. She was provided with Vaccine Information Sheet and instruction to access the V-Safe system.   Michele Kirby was instructed to call 911 with any severe reactions post vaccine: Marland Kitchen Difficulty breathing  . Swelling of face and throat  . A fast heartbeat  . A bad rash all over body  . Dizziness and weakness

## 2020-10-10 ENCOUNTER — Encounter: Payer: Self-pay | Admitting: Internal Medicine

## 2023-08-12 DIAGNOSIS — E109 Type 1 diabetes mellitus without complications: Secondary | ICD-10-CM | POA: Diagnosis not present

## 2023-08-12 DIAGNOSIS — E1065 Type 1 diabetes mellitus with hyperglycemia: Secondary | ICD-10-CM | POA: Diagnosis not present

## 2023-08-12 DIAGNOSIS — Z23 Encounter for immunization: Secondary | ICD-10-CM | POA: Diagnosis not present

## 2023-09-15 DIAGNOSIS — H25013 Cortical age-related cataract, bilateral: Secondary | ICD-10-CM | POA: Diagnosis not present

## 2023-09-15 DIAGNOSIS — H5213 Myopia, bilateral: Secondary | ICD-10-CM | POA: Diagnosis not present

## 2023-09-15 DIAGNOSIS — H524 Presbyopia: Secondary | ICD-10-CM | POA: Diagnosis not present

## 2023-09-15 DIAGNOSIS — H2513 Age-related nuclear cataract, bilateral: Secondary | ICD-10-CM | POA: Diagnosis not present

## 2023-09-15 DIAGNOSIS — E113293 Type 2 diabetes mellitus with mild nonproliferative diabetic retinopathy without macular edema, bilateral: Secondary | ICD-10-CM | POA: Diagnosis not present

## 2023-12-09 DIAGNOSIS — E1065 Type 1 diabetes mellitus with hyperglycemia: Secondary | ICD-10-CM | POA: Diagnosis not present

## 2023-12-09 DIAGNOSIS — E559 Vitamin D deficiency, unspecified: Secondary | ICD-10-CM | POA: Diagnosis not present

## 2023-12-09 DIAGNOSIS — E785 Hyperlipidemia, unspecified: Secondary | ICD-10-CM | POA: Diagnosis not present

## 2023-12-16 DIAGNOSIS — E1065 Type 1 diabetes mellitus with hyperglycemia: Secondary | ICD-10-CM | POA: Diagnosis not present

## 2023-12-16 DIAGNOSIS — Z1331 Encounter for screening for depression: Secondary | ICD-10-CM | POA: Diagnosis not present

## 2023-12-16 DIAGNOSIS — R82998 Other abnormal findings in urine: Secondary | ICD-10-CM | POA: Diagnosis not present

## 2023-12-16 DIAGNOSIS — Z1339 Encounter for screening examination for other mental health and behavioral disorders: Secondary | ICD-10-CM | POA: Diagnosis not present

## 2023-12-16 DIAGNOSIS — Z0000 Encounter for general adult medical examination without abnormal findings: Secondary | ICD-10-CM | POA: Diagnosis not present

## 2024-03-17 DIAGNOSIS — E109 Type 1 diabetes mellitus without complications: Secondary | ICD-10-CM | POA: Diagnosis not present
